# Patient Record
Sex: Female | Born: 1975 | Race: White | Hispanic: No | Marital: Married
Health system: Southern US, Community
[De-identification: ages and names within clinical notes are randomized; demographics above are authoritative.]

---

## 2009-01-18 ENCOUNTER — Ambulatory Visit: Payer: Self-pay | Admitting: Internal Medicine

## 2010-06-30 ENCOUNTER — Ambulatory Visit: Payer: Self-pay | Admitting: Internal Medicine

## 2012-07-10 ENCOUNTER — Ambulatory Visit: Payer: Self-pay | Admitting: Internal Medicine

## 2012-07-10 ENCOUNTER — Observation Stay: Payer: Self-pay | Admitting: Surgery

## 2012-07-10 LAB — COMPREHENSIVE METABOLIC PANEL
Alkaline Phosphatase: 52 U/L (ref 50–136)
Anion Gap: 5 — ABNORMAL LOW (ref 7–16)
BUN: 6 mg/dL — ABNORMAL LOW (ref 7–18)
Calcium, Total: 8.3 mg/dL — ABNORMAL LOW (ref 8.5–10.1)
Chloride: 111 mmol/L — ABNORMAL HIGH (ref 98–107)
Creatinine: 0.82 mg/dL (ref 0.60–1.30)
Osmolality: 274 (ref 275–301)
SGOT(AST): 15 U/L (ref 15–37)
SGPT (ALT): 16 U/L (ref 12–78)
Sodium: 139 mmol/L (ref 136–145)

## 2012-07-10 LAB — URINALYSIS, COMPLETE
Bacteria: NONE SEEN
Ketone: NEGATIVE
Nitrite: NEGATIVE
Ph: 7 (ref 4.5–8.0)
Protein: NEGATIVE
RBC,UR: 34 /HPF (ref 0–5)
Specific Gravity: 1.014 (ref 1.003–1.030)
Squamous Epithelial: 1
WBC UR: 3 /HPF (ref 0–5)

## 2012-07-10 LAB — CBC
MCH: 31.8 pg (ref 26.0–34.0)
MCHC: 33.8 g/dL (ref 32.0–36.0)
Platelet: 176 10*3/uL (ref 150–440)
RBC: 4.29 10*6/uL (ref 3.80–5.20)
RDW: 13.6 % (ref 11.5–14.5)

## 2012-07-11 LAB — COMPREHENSIVE METABOLIC PANEL
Albumin: 3.1 g/dL — ABNORMAL LOW (ref 3.4–5.0)
Alkaline Phosphatase: 49 U/L — ABNORMAL LOW (ref 50–136)
Anion Gap: 6 — ABNORMAL LOW (ref 7–16)
Bilirubin,Total: 0.8 mg/dL (ref 0.2–1.0)
Calcium, Total: 7.8 mg/dL — ABNORMAL LOW (ref 8.5–10.1)
Chloride: 115 mmol/L — ABNORMAL HIGH (ref 98–107)
Co2: 22 mmol/L (ref 21–32)
Creatinine: 0.69 mg/dL (ref 0.60–1.30)
EGFR (African American): >60
Potassium: 3.6 mmol/L (ref 3.5–5.1)
SGOT(AST): 19 U/L (ref 15–37)

## 2012-07-11 LAB — CBC WITH DIFFERENTIAL/PLATELET
Basophil #: 0 10*3/uL (ref 0.0–0.1)
Basophil %: 0.4 %
Lymphocyte #: 1.7 10*3/uL (ref 1.0–3.6)
Lymphocyte %: 24.8 %
MCH: 31.6 pg (ref 26.0–34.0)
MCHC: 33.4 g/dL (ref 32.0–36.0)
Monocyte #: 0.4 x10 3/mm (ref 0.2–0.9)
Monocyte %: 6.1 %
Neutrophil #: 4.6 10*3/uL (ref 1.4–6.5)
Platelet: 142 10*3/uL — ABNORMAL LOW (ref 150–440)
RDW: 13.4 % (ref 11.5–14.5)
WBC: 6.8 10*3/uL (ref 3.6–11.0)

## 2012-07-11 LAB — LIPASE, BLOOD: Lipase: 51 U/L — ABNORMAL LOW (ref 73–393)

## 2012-07-11 LAB — APTT: Activated PTT: 31.8 secs (ref 23.6–35.9)

## 2012-07-15 LAB — PATHOLOGY REPORT

## 2014-06-10 NOTE — Op Note (Signed)
PATIENT NAME:  Brooke Charles, Brooke Charles MR#:  [id number] DATE OF BIRTH:  1975/12/10  DATE OF PROCEDURE:  07/11/2012  PREOPERATIVE DIAGNOSIS: Acute cholecystitis.   POSTOPERATIVE DIAGNOSIS: Acute cholecystitis.   PROCEDURE PERFORMED: Laparoscopic cholecystectomy.   SURGEON: Natale LayMark Savino Whisenant, Charles.D.   ASSISTANT: None.   ANESTHESIA: General endotracheal.   FINDINGS: As above.   SPECIMENS: Gallbladder, with contents.   ESTIMATED BLOOD LOSS: 25 mL.   DRAINS: None.   Lap and needle counts: Correct x 2.   DESCRIPTION OF PROCEDURE: With informed consent, supine position, general endotracheal anesthesia, the abdomen was widely-prepped and draped. Left arm was padded and tucked at her side. Time-out was observed.   A 12 mm Hassan trocar was placed with an infraumbilical transversely-oriented skin incision with an open technique. Pneumoperitoneum was established. A 5 mm bladeless trocar was placed in the epigastric region. Two 5 mm ports were then placed in the right lateral abdomen. The gallbladder was grasped along its fundus and elevated towards the right shoulder. Lateral traction was achieved on the right Hartmann's pouch. The hepatoduodenal ligament was then incised with a critical view of safety, cholecystectomy being achieved. Three clips were then  placed in the portal side of the structure of the cystic duct structure, 3 clips on the cystic artery structure, 1 clip on the gallbladder side, and the structures were then divided. The gallbladder was then retrieved out of the gallbladder fossa utilizing the hook cautery apparatus, placed into an EndoCatch device, and retrieved. The right upper quadrant was irrigated with a total of 1 liter of normal saline during the operation, aspirated dry. Point hemostasis was obtained with electrocautery. Ports were then removed under direct visualization. The infraumbilical fascial defect was reapproximated with a figure-of-eight #0 Vicryl suture in vertical orientation, the  existing stay sutures tied to each other. A total of 30 mL of 0.25% plain Marcaine was injected along all skin and fascial incisions prior to closure.   Vicryl 4-0 subcuticular was applied to all skin edges, followed by the application of benzoin, Steri-Strips, Telfa, and Tegaderm. The patient was then subsequently extubated and taken to the recovery room in stable and satisfactory condition by anesthesia services.     ____________________________ Redge GainerMark A. Egbert GaribaldiBird, MD mab:dm D: 07/11/2012 17:58:01 ET T: 07/12/2012 09:39:24 ET JOB#: [id number]  cc: Loraine LericheMark A. Egbert GaribaldiBird, MD, <Dictator> Raynald KempMARK A Anahita Cua MD ELECTRONICALLY SIGNED 07/14/2012 13:09

## 2014-06-10 NOTE — H&P (Signed)
History of Present Illness [age] yowf with a 4 month h/o RUQ/R flank pain, worse at time of q21day menstruation. She had pain in between periods too. The pain waxes and wanes. She has felt feverish but has not taken her temperature. She has had some nausea, and has vomited twoce in 4 months. 8 days ago her pain became more severe, and she had a CT showing possible acute calculous cholecystitis. She had a pelvic exam and (? cervical) biopsy this AM. No change in the color of her urine, stool, skin, or eyes.   Past Med/Surgical Hx:  htn:   Migraines:   Tubal Ligation:   ALLERGIES:  No Known Allergies:   HOME MEDICATIONS: Medication Instructions Status  Topamax 150 milligram(s) orally once a day (at bedtime) Active   Family and Social History:  Family History Non-Contributory   Social History negative tobacco, negative ETOH, married with two kids at home (16 and 3221), works in assisted living facility, no smoking, rare ETOH   Place of Living Home   Review of Systems:  Fever/Chills No   Cough No   Sputum No   Abdominal Pain Yes   Diarrhea No   Constipation No   Nausea/Vomiting Yes   SOB/DOE No   Chest Pain No   Dysuria No   Tolerating PT Yes   Tolerating Diet Yes  Nauseated   Medications/Allergies Reviewed Medications/Allergies reviewed   Physical Exam:  GEN well developed, well nourished, in obvious pain   HEENT pink conjunctivae, PERRL, hearing intact to voice, moist oral mucosa   NECK supple   RESP normal resp effort  clear BS  no use of accessory muscles   CARD regular rate  no murmur  no JVD  no Rub   ABD denies tenderness  no hernia  soft  normal BS  no Adominal Mass   LYMPH negative neck   EXTR negative cyanosis/clubbing, negative edema   SKIN normal to palpation, No rashes, No ulcers, skin turgor good   NEURO cranial nerves intact, cranial nerves deficit, negative tremor, follows commands, motor/sensory function intact   PSYCH alert, A+O to  time, place, person, good insight   Lab Results: Hepatic:  23-May-14 14:40   Bilirubin, Total 0.7  Alkaline Phosphatase 52  SGPT (ALT) 16  SGOT (AST) 15  Total Protein, Serum 7.0  Albumin, Serum 4.1  Routine Chem:  23-May-14 14:40   Glucose, Serum 84  BUN  6  Creatinine (comp) 0.82  Sodium, Serum 139  Potassium, Serum 3.8  Chloride, Serum  111  CO2, Serum 23  Calcium (Total), Serum  8.3  Osmolality (calc) 274  Anion Gap  5 (Result(s) reported on 10 Jul 2012 at 03:16PM.)  Lipase 205 (Result(s) reported on 10 Jul 2012 at 03:16PM.)  Routine UA:  23-May-14 14:40   Color (UA) Straw  Clarity (UA) Clear  Glucose (UA) Negative  Bilirubin (UA) Negative  Ketones (UA) Negative  Specific Gravity (UA) 1.014  Blood (UA) 3+  pH (UA) 7.0  Protein (UA) Negative  Nitrite (UA) Negative  Leukocyte Esterase (UA) 1+ (Result(s) reported on 10 Jul 2012 at 03:20PM.)  RBC (UA) 34 /HPF  WBC (UA) 3 /HPF  Bacteria (UA) NONE SEEN  Epithelial Cells (UA) 1 /HPF (Result(s) reported on 10 Jul 2012 at 03:20PM.)  Routine Sero:  23-May-14 14:40   Pregnancy Test, Urine NEGATIVE (The results of the qualitative urine HCG (Pregnancy Test) should be evaluated in light of other clinical information.  There are limitations to the  test which, in certain clinical situations, may result in a false positive or negative result. Thehigh dose hook effect can occur in urine samples with extremely high HCG concentrations.  This effect can produce a negative result in certain situations. It is suggested that results of the qualitative HCG be confirmed by an alternate methodology, such as the quantitative serum beta HCG test.)  Routine Hem:  23-May-14 14:40   WBC (CBC) 6.8  RBC (CBC) 4.29  Hemoglobin (CBC) 13.6  Hematocrit (CBC) 40.4  Platelet Count (CBC) 176 (Result(s) reported on 10 Jul 2012 at 03:01PM.)  MCV 94  MCH 31.8  MCHC 33.8  RDW 13.6   Radiology Results: [place]:    23-May-14 17:30, US Abdomen  Limited Survey  US Abdomen Limited Survey  REASON FOR EXAM:    ABDOMINAL PAIN  COMMENTS:   Body Site: GB and Fossa, CBD, Head of Pancreas; Right Upper   Quad    PROCEDURE: [place]  - US ABDOMEN LIMITED SURVEY  - Jul 10 2012  5:30PM     RESULT: The comparison: CT of the abdomen and pelvis performedsame day.    Technique: Multiple grayscale and color Doppler images were obtained of   the right upper quadrant.    Findings:  The pancreas was partially obscured overlying bowel gas. The visualized   portion the pancreatic head is unremarkable. There multiple stones within   the gallbladder. There is a 1.3 cm stone within the gallbladder neck,     which by report was not mobile. The gallbladder wall is thickened,   measuring 4 mm in thickness. The sonographic Eulah Pont sign could not be   accurately assessed secondary to patient's medicated state. There are   several thickened septations versus folds in the gallbladder. The common   bile duct measures 6 mm in diameter, which is at the upper limits of   normal.    The visualized liver is unremarkable. The main portal vein is patent.    IMPRESSION:   1. Cholelithiasis, with nonmobile stone in the bladder neck. The   gallbladder wall is thickened. The sonographic Eulah Pont sign could not be   accurately assessed secondary to patient medicated state. Correlate for   acute cholecystitis.  2. The common bile duct is at the upper lungs are normal in diameter.     Clinical relation is recommended.      Dictation Site: 8        Verified By: Lewie Chamber, M.D., MD  LabUnknown:    23-May-14 09:38, CT Abdomen and Pelvis With Contrast  PACS Image    23-May-14 17:30, US Abdomen Limited Survey  PACS Image  CT:    23-May-14 09:38, CT Abdomen and Pelvis With Contrast  CT Abdomen and Pelvis With Contrast  REASON FOR EXAM:    severe rt flank pain  COMMENTS:       PROCEDURE: [facility] - KCT ABDOMEN/PELVIS W  - Jul 10 2012  9:38AM     RESULT:   Comparison:  None    Technique: Multiple axial images of the abdomen and pelvis were performed   from the lung bases to the pubic symphysis, with p.o. contrast and with   85 mL of Isovue 370 intravenous contrast.    Findings:  The liver, spleen, adrenals, and pancreas are unremarkable. The kidneys   enhance normally. Stones are seen within the gallbladder. There are areas   within the gallbladder which may represent focal areas of circumferential     wall thickening versus  large stones with central lucency within the   gallbladder. Additionally, there is suggestion of mild pericholecystic   stranding.    The appendix is normal. The small and large bowel are normal in caliber.    No aggressive lytic or sclerotic osseous lesions are identified.    IMPRESSION:    Cholelithiasis. Additionally, there are areas the of the gallbladder   which may represent large stones versus focal areas of circumferential   wall thickening. There is suggestion of mild pericholecystic stranding.   Correlate for cholecystitis. Right upper quadrant ultrasound is the   suggested for further evaluation.  This was called to Leafy Kindle at 1109 hours 07/10/2012.        Verified By: Lewie Chamber, M.D., MD    Assessment/Admission Diagnosis Gallbladder hydrops vs Sx cholelithiasis   Plan Admit, IVF, lap CCY in AM. Pt understands risks of proposed procedure (1/200 CBD injury), likely hospital course, etc. and wishes to proceed.   Electronic Signatures: Claude Manges (MD)  (Signed [phone] 21:09)  Authored: CHIEF COMPLAINT and HISTORY, PAST MEDICAL/SURGIAL HISTORY, ALLERGIES, HOME MEDICATIONS, FAMILY AND SOCIAL HISTORY, REVIEW OF SYSTEMS, PHYSICAL EXAM, LABS, Radiology, ASSESSMENT AND PLAN   Last Updated: 23-May-14 21:09 by Claude Manges (MD)

## 2017-02-26 ENCOUNTER — Other Ambulatory Visit: Payer: Self-pay | Admitting: Sports Medicine

## 2017-02-26 DIAGNOSIS — M7542 Impingement syndrome of left shoulder: Secondary | ICD-10-CM

## 2017-02-26 DIAGNOSIS — M7552 Bursitis of left shoulder: Secondary | ICD-10-CM

## 2017-03-04 ENCOUNTER — Ambulatory Visit
Admission: RE | Admit: 2017-03-04 | Discharge: 2017-03-04 | Disposition: A | Discharge status: Home / Self Care | Payer: BLUE CROSS/BLUE SHIELD | Source: Ambulatory Visit | Attending: Sports Medicine | Admitting: Sports Medicine

## 2017-03-04 ENCOUNTER — Encounter: Payer: Self-pay | Admitting: Radiology

## 2017-03-04 DIAGNOSIS — M25619 Stiffness of unspecified shoulder, not elsewhere classified: Secondary | ICD-10-CM | POA: Diagnosis present

## 2017-03-04 DIAGNOSIS — M7542 Impingement syndrome of left shoulder: Secondary | ICD-10-CM

## 2017-03-04 DIAGNOSIS — M19012 Primary osteoarthritis, left shoulder: Secondary | ICD-10-CM | POA: Insufficient documentation

## 2017-03-04 DIAGNOSIS — R29898 Other symptoms and signs involving the musculoskeletal system: Secondary | ICD-10-CM | POA: Diagnosis present

## 2017-03-04 DIAGNOSIS — M7552 Bursitis of left shoulder: Secondary | ICD-10-CM

## 2017-03-04 DIAGNOSIS — R9389 Abnormal findings on diagnostic imaging of other specified body structures: Secondary | ICD-10-CM | POA: Diagnosis not present

## 2021-02-15 ENCOUNTER — Other Ambulatory Visit: Payer: Self-pay | Admitting: Internal Medicine

## 2021-02-15 DIAGNOSIS — Z1231 Encounter for screening mammogram for malignant neoplasm of breast: Secondary | ICD-10-CM

## 2021-03-06 ENCOUNTER — Ambulatory Visit: Payer: BLUE CROSS/BLUE SHIELD

## 2021-03-21 ENCOUNTER — Other Ambulatory Visit: Payer: Self-pay

## 2021-03-21 ENCOUNTER — Ambulatory Visit
Admission: RE | Admit: 2021-03-21 | Discharge: 2021-03-21 | Disposition: A | Discharge status: Home / Self Care | Payer: BC Managed Care – PPO | Source: Ambulatory Visit | Attending: Internal Medicine | Admitting: Internal Medicine

## 2021-03-21 DIAGNOSIS — Z1231 Encounter for screening mammogram for malignant neoplasm of breast: Secondary | ICD-10-CM | POA: Insufficient documentation

## 2021-03-27 ENCOUNTER — Other Ambulatory Visit: Payer: Self-pay | Admitting: Internal Medicine

## 2021-03-27 DIAGNOSIS — R928 Other abnormal and inconclusive findings on diagnostic imaging of breast: Secondary | ICD-10-CM

## 2021-03-27 DIAGNOSIS — N6310 Unspecified lump in the right breast, unspecified quadrant: Secondary | ICD-10-CM

## 2021-04-03 ENCOUNTER — Ambulatory Visit
Admission: RE | Admit: 2021-04-03 | Discharge: 2021-04-03 | Disposition: A | Discharge status: Home / Self Care | Payer: BC Managed Care – PPO | Source: Ambulatory Visit | Attending: Internal Medicine | Admitting: Internal Medicine

## 2021-04-03 ENCOUNTER — Other Ambulatory Visit: Payer: Self-pay

## 2021-04-03 DIAGNOSIS — N6310 Unspecified lump in the right breast, unspecified quadrant: Secondary | ICD-10-CM | POA: Insufficient documentation

## 2021-04-03 DIAGNOSIS — R928 Other abnormal and inconclusive findings on diagnostic imaging of breast: Secondary | ICD-10-CM

## 2021-04-05 ENCOUNTER — Other Ambulatory Visit: Payer: Self-pay | Admitting: Internal Medicine

## 2021-04-05 DIAGNOSIS — N6310 Unspecified lump in the right breast, unspecified quadrant: Secondary | ICD-10-CM

## 2021-10-02 ENCOUNTER — Ambulatory Visit
Admission: RE | Admit: 2021-10-02 | Discharge: 2021-10-02 | Disposition: A | Discharge status: Home / Self Care | Payer: BC Managed Care – PPO | Source: Ambulatory Visit | Attending: Internal Medicine | Admitting: Internal Medicine

## 2021-10-02 DIAGNOSIS — N6310 Unspecified lump in the right breast, unspecified quadrant: Secondary | ICD-10-CM | POA: Insufficient documentation

## 2022-03-21 ENCOUNTER — Other Ambulatory Visit: Payer: Self-pay | Admitting: Internal Medicine

## 2022-03-21 DIAGNOSIS — N63 Unspecified lump in unspecified breast: Secondary | ICD-10-CM

## 2022-03-29 ENCOUNTER — Ambulatory Visit
Admission: RE | Admit: 2022-03-29 | Discharge: 2022-03-29 | Disposition: A | Discharge status: Home / Self Care | Payer: BC Managed Care – PPO | Source: Ambulatory Visit | Attending: Internal Medicine | Admitting: Internal Medicine

## 2022-03-29 DIAGNOSIS — N63 Unspecified lump in unspecified breast: Secondary | ICD-10-CM | POA: Insufficient documentation

## 2022-08-21 IMAGING — MG MM DIGITAL DIAGNOSTIC UNILAT*R* W/ TOMO W/ CAD
4 series · 4 of 12 positions shown · non-contrast
Comparison: Previous exam(s).

CLINICAL DATA: Patient recalled from screening for right breast
mass.

EXAM:
DIGITAL DIAGNOSTIC UNILATERAL RIGHT MAMMOGRAM WITH TOMOSYNTHESIS AND
CAD; ULTRASOUND RIGHT BREAST LIMITED
TECHNIQUE: Right digital diagnostic mammography and breast tomosynthesis was
performed. The images were evaluated with computer-aided detection.;
Targeted ultrasound examination of the right breast was performed

[R CC synth-2D]
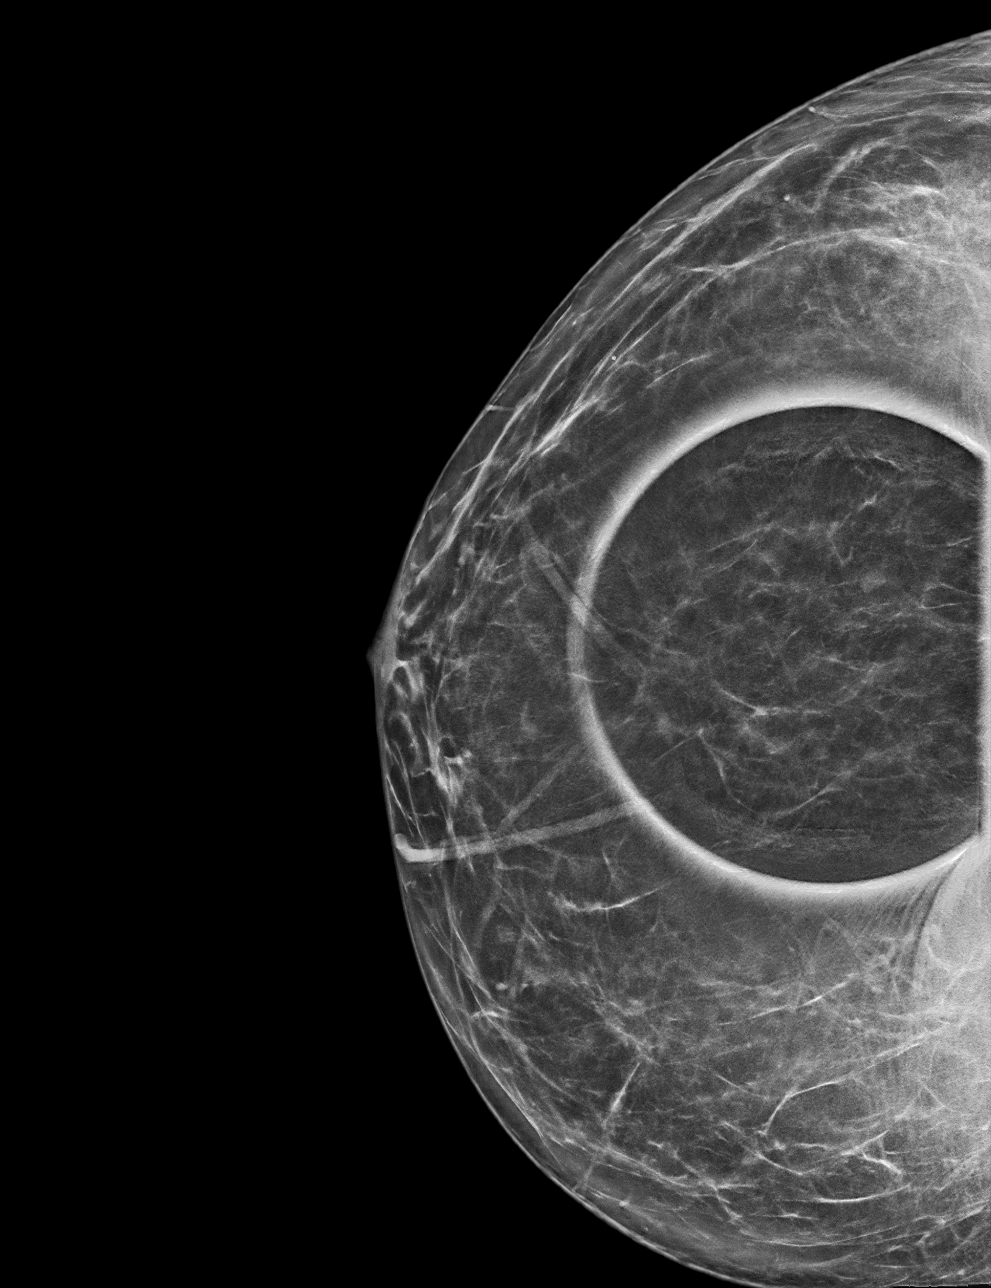

[R ML synth-2D]
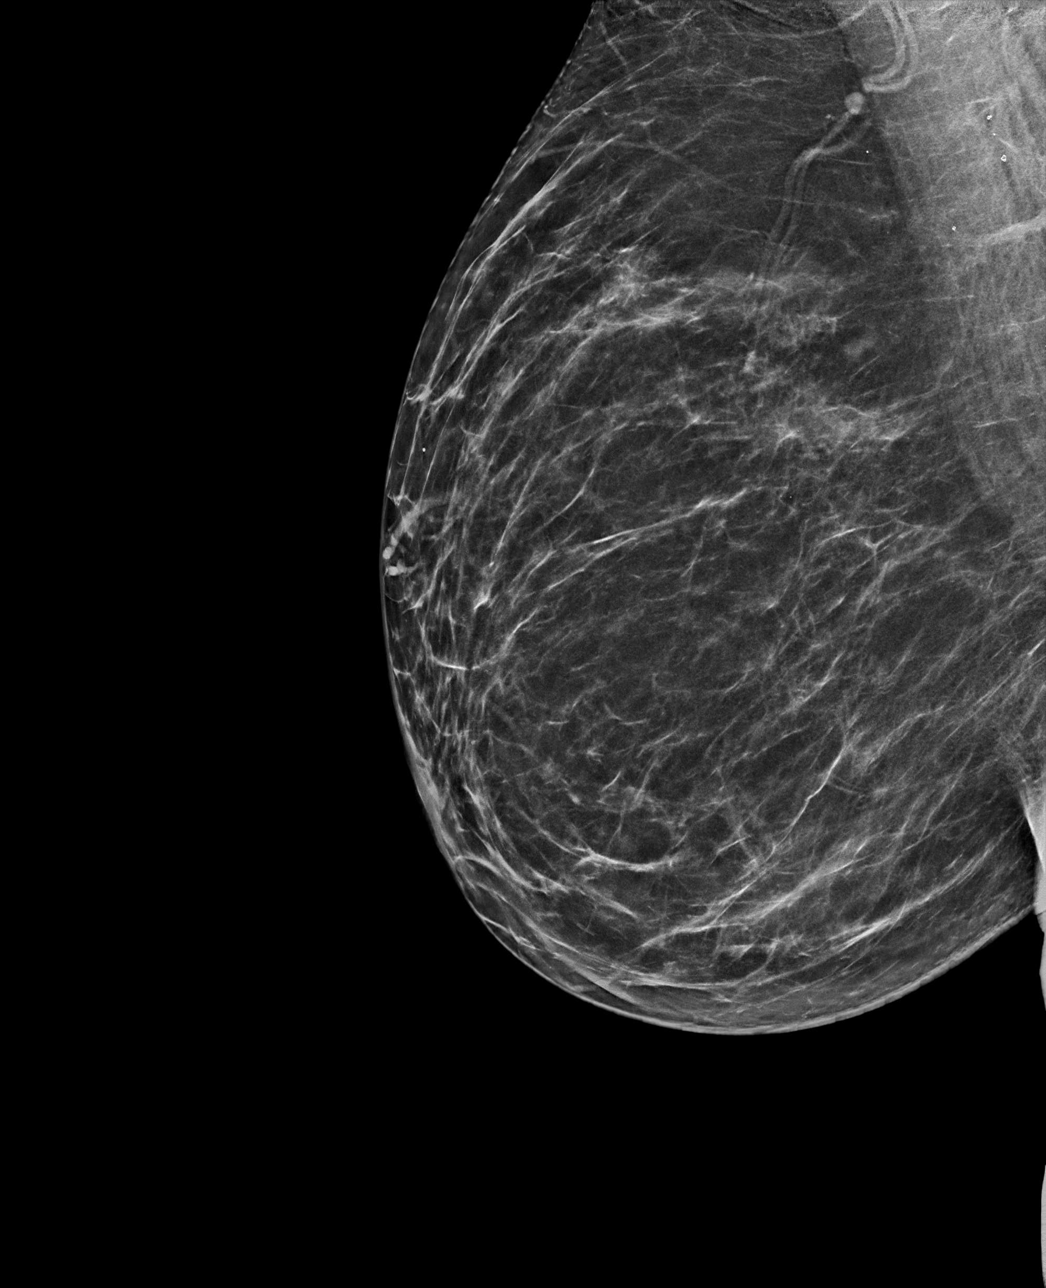

[R CC tomo · tomo slice 32/63.0]
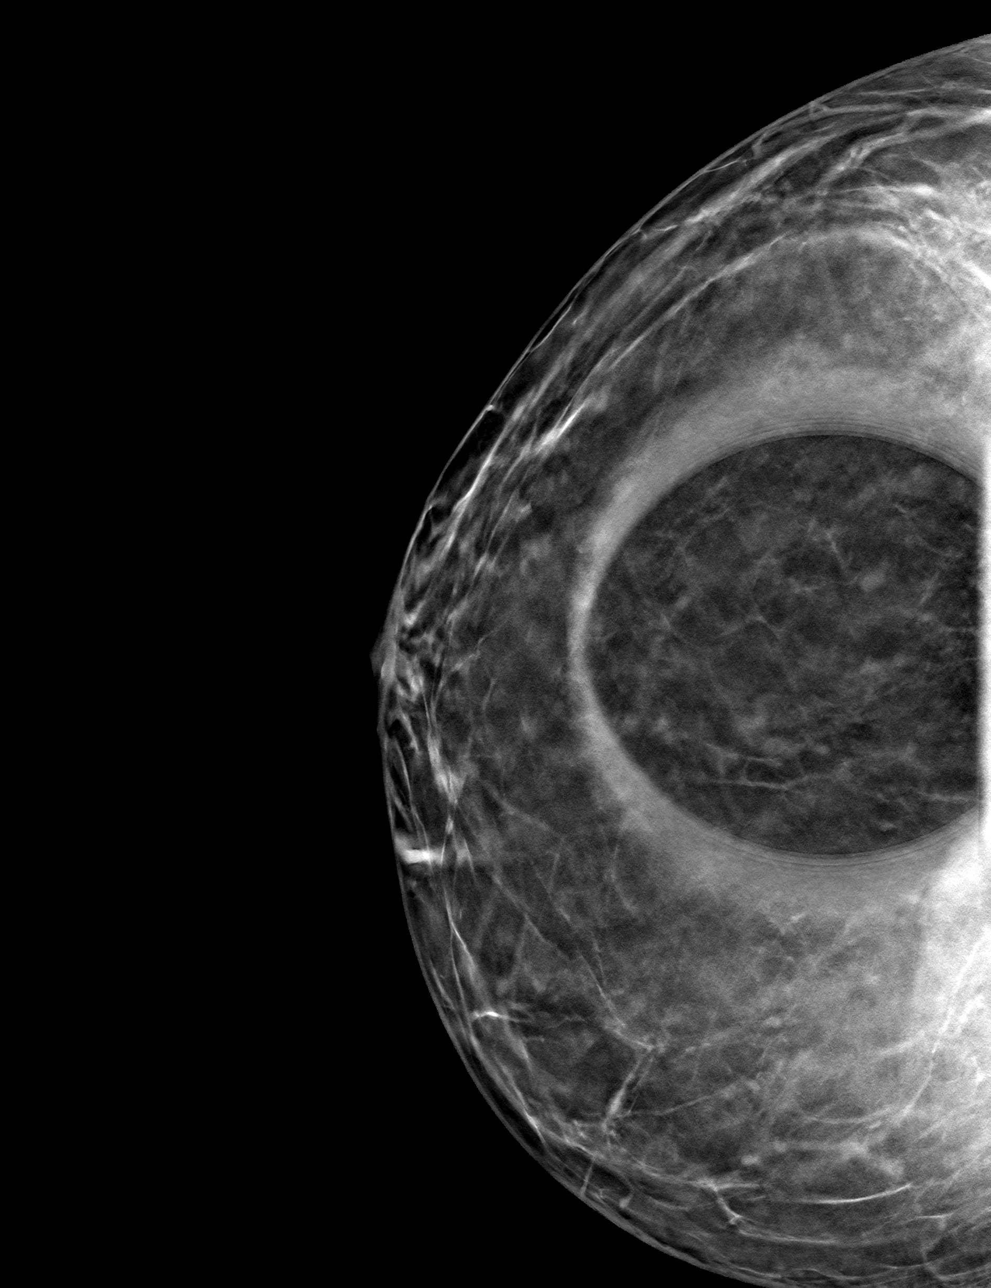

[R ML tomo · tomo slice 37/72.0]
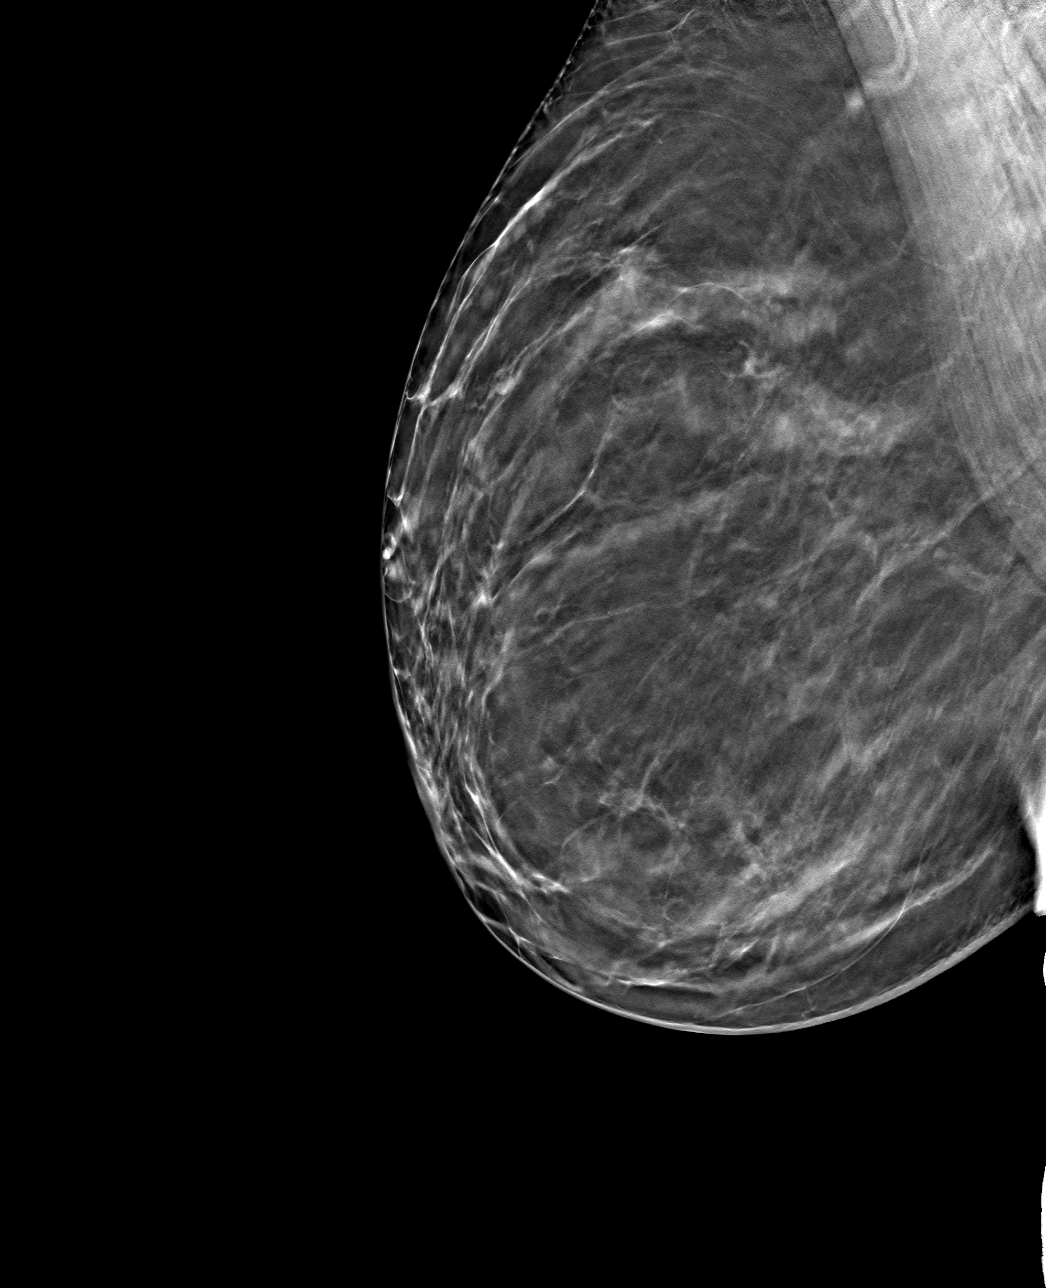

[4 of 12 positions shown; findings below may reference images not displayed]

ACR Breast Density Category c: The breast tissue is heterogeneously
dense, which may obscure small masses.
FINDINGS: There is a persistent small oval circumscribed mass within the 11-12
o'clock position middle to posterior depth right breast further
evaluated with spot compression views.

Targeted ultrasound is performed, showing a 4 x 2 x 4 mm probable
complicated cyst right breast 11 o'clock position 8 cm from nipple.
IMPRESSION: Probably benign right breast mass 11 o'clock position, favored to
represent a complicated cyst.

RECOMMENDATION:
Right breast diagnostic mammogram and ultrasound in 6 months to
reassess probably benign right breast mass.

I have discussed the findings and recommendations with the patient.
If applicable, a reminder letter will be sent to the patient
regarding the next appointment.

BI-RADS CATEGORY  3: Probably benign.

## 2022-08-21 IMAGING — US US BREAST*R* LIMITED INC AXILLA
1 series · 5 of 5 positions shown · non-contrast
Comparison: Previous exam(s).

CLINICAL DATA: Patient recalled from screening for right breast
mass.

EXAM:
DIGITAL DIAGNOSTIC UNILATERAL RIGHT MAMMOGRAM WITH TOMOSYNTHESIS AND
CAD; ULTRASOUND RIGHT BREAST LIMITED
TECHNIQUE: Right digital diagnostic mammography and breast tomosynthesis was
performed. The images were evaluated with computer-aided detection.;
Targeted ultrasound examination of the right breast was performed

[Series 1: us breast*right* limited inc axilla · 0.07mm/px · 5 of 5 slices shown]
[im 1/5]
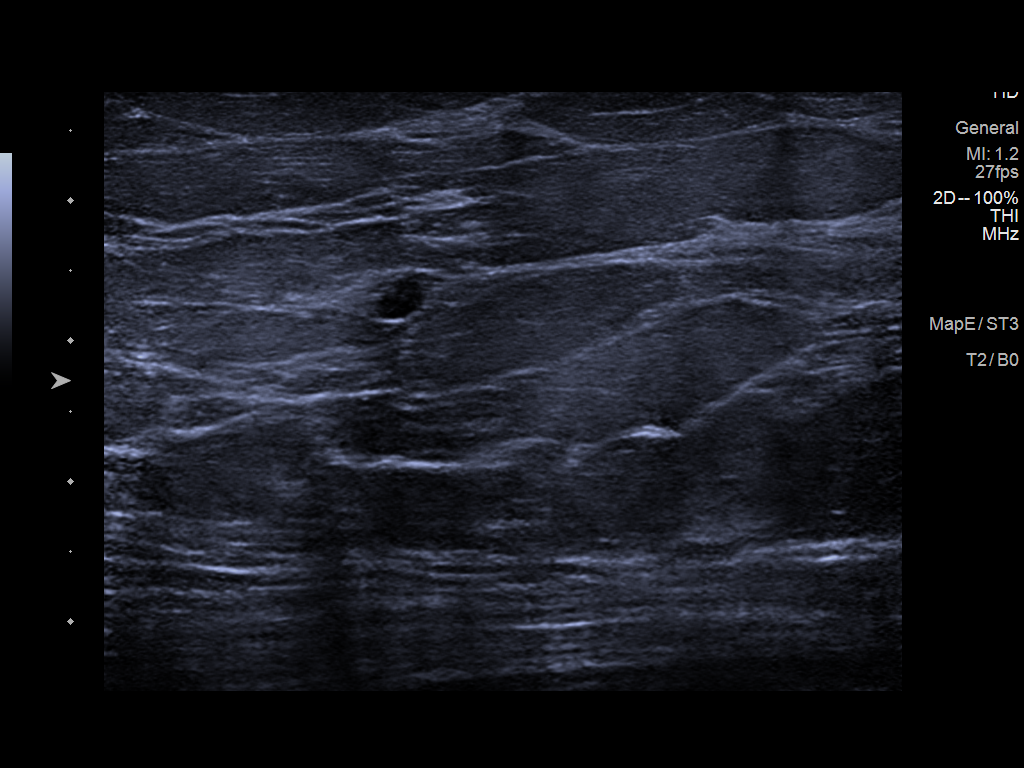
[im 2/5]
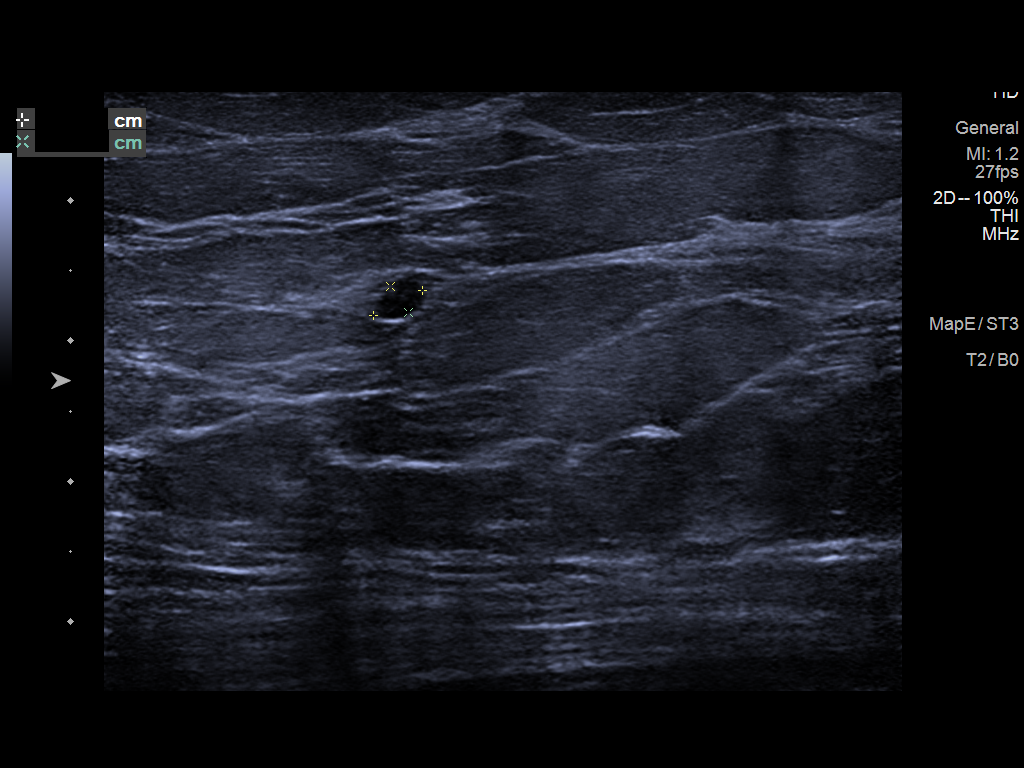
[im 3/5]
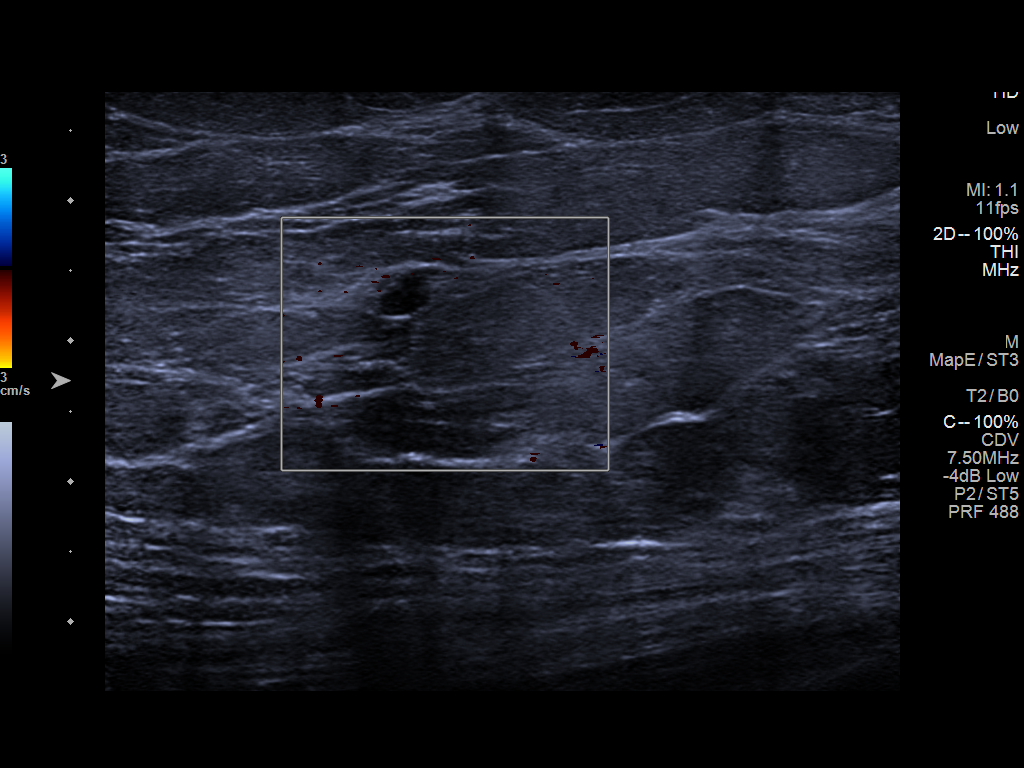
[im 4/5]
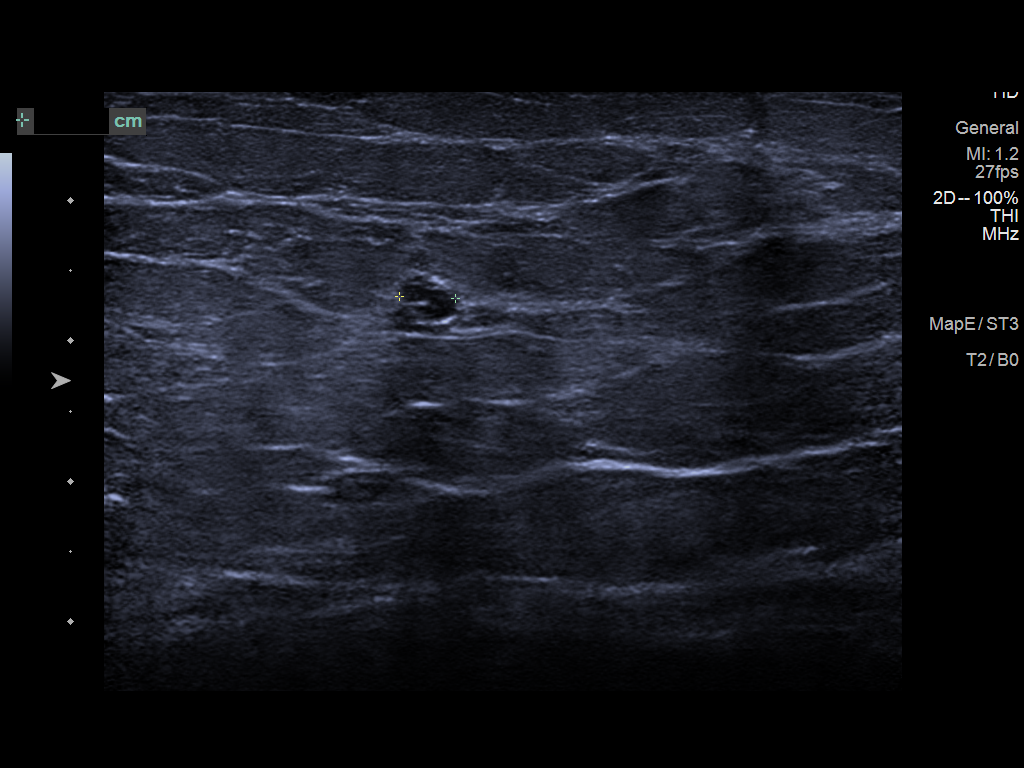
[im 5/5]
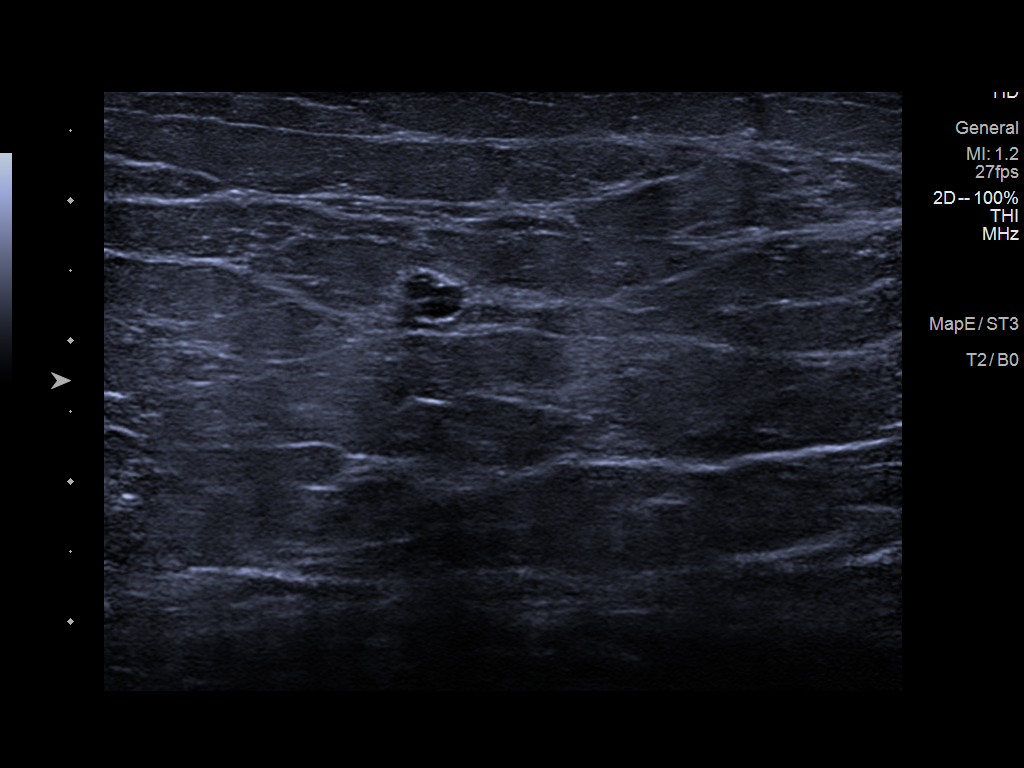

[5 of 5 positions shown; findings below may reference images not displayed]

ACR Breast Density Category c: The breast tissue is heterogeneously
dense, which may obscure small masses.
FINDINGS: There is a persistent small oval circumscribed mass within the 11-12
o'clock position middle to posterior depth right breast further
evaluated with spot compression views.

Targeted ultrasound is performed, showing a 4 x 2 x 4 mm probable
complicated cyst right breast 11 o'clock position 8 cm from nipple.
IMPRESSION: Probably benign right breast mass 11 o'clock position, favored to
represent a complicated cyst.

RECOMMENDATION:
Right breast diagnostic mammogram and ultrasound in 6 months to
reassess probably benign right breast mass.

I have discussed the findings and recommendations with the patient.
If applicable, a reminder letter will be sent to the patient
regarding the next appointment.

BI-RADS CATEGORY  3: Probably benign.

## 2023-11-24 ENCOUNTER — Other Ambulatory Visit: Payer: Self-pay | Admitting: Internal Medicine

## 2023-11-24 DIAGNOSIS — Z1231 Encounter for screening mammogram for malignant neoplasm of breast: Secondary | ICD-10-CM

## 2023-11-24 DIAGNOSIS — N6310 Unspecified lump in the right breast, unspecified quadrant: Secondary | ICD-10-CM

## 2023-11-26 ENCOUNTER — Other Ambulatory Visit: Payer: Self-pay | Admitting: Sports Medicine

## 2023-11-26 DIAGNOSIS — M19011 Primary osteoarthritis, right shoulder: Secondary | ICD-10-CM

## 2023-11-26 DIAGNOSIS — M7501 Adhesive capsulitis of right shoulder: Secondary | ICD-10-CM

## 2023-11-26 DIAGNOSIS — G8929 Other chronic pain: Secondary | ICD-10-CM

## 2023-11-26 DIAGNOSIS — M7551 Bursitis of right shoulder: Secondary | ICD-10-CM

## 2023-11-26 DIAGNOSIS — M7541 Impingement syndrome of right shoulder: Secondary | ICD-10-CM

## 2023-11-26 DIAGNOSIS — M7581 Other shoulder lesions, right shoulder: Secondary | ICD-10-CM

## 2023-12-02 ENCOUNTER — Ambulatory Visit
Admission: RE | Admit: 2023-12-02 | Discharge: 2023-12-02 | Disposition: A | Discharge status: Home / Self Care | Source: Ambulatory Visit | Attending: Sports Medicine | Admitting: Sports Medicine

## 2023-12-02 DIAGNOSIS — M25511 Pain in right shoulder: Secondary | ICD-10-CM | POA: Diagnosis present

## 2023-12-02 DIAGNOSIS — M7581 Other shoulder lesions, right shoulder: Secondary | ICD-10-CM | POA: Diagnosis present

## 2023-12-02 DIAGNOSIS — M7501 Adhesive capsulitis of right shoulder: Secondary | ICD-10-CM | POA: Diagnosis present

## 2023-12-02 DIAGNOSIS — M19011 Primary osteoarthritis, right shoulder: Secondary | ICD-10-CM | POA: Insufficient documentation

## 2023-12-02 DIAGNOSIS — M7541 Impingement syndrome of right shoulder: Secondary | ICD-10-CM | POA: Diagnosis present

## 2023-12-02 DIAGNOSIS — G8929 Other chronic pain: Secondary | ICD-10-CM | POA: Diagnosis present

## 2023-12-02 DIAGNOSIS — M7551 Bursitis of right shoulder: Secondary | ICD-10-CM | POA: Diagnosis present

## 2023-12-17 ENCOUNTER — Ambulatory Visit
Admission: RE | Admit: 2023-12-17 | Discharge: 2023-12-17 | Disposition: A | Source: Ambulatory Visit | Attending: Internal Medicine | Admitting: Internal Medicine

## 2023-12-17 ENCOUNTER — Ambulatory Visit
Admission: RE | Admit: 2023-12-17 | Discharge: 2023-12-17 | Disposition: A | Discharge status: Home / Self Care | Source: Ambulatory Visit | Attending: Internal Medicine | Admitting: Internal Medicine

## 2023-12-17 DIAGNOSIS — Z1231 Encounter for screening mammogram for malignant neoplasm of breast: Secondary | ICD-10-CM | POA: Diagnosis present

## 2023-12-17 DIAGNOSIS — N6310 Unspecified lump in the right breast, unspecified quadrant: Secondary | ICD-10-CM | POA: Insufficient documentation
